# Patient Record
Sex: Female | Born: 1970 | Race: White | Hispanic: No | Marital: Single | Smoking: Never smoker
Health system: Southern US, Community
[De-identification: ages and names within clinical notes are randomized; demographics above are authoritative.]

## PROBLEM LIST (undated history)

## (undated) DIAGNOSIS — G43909 Migraine, unspecified, not intractable, without status migrainosus: Secondary | ICD-10-CM

## (undated) HISTORY — PX: TUBAL LIGATION: SHX77

---

## 2016-06-13 ENCOUNTER — Encounter (HOSPITAL_COMMUNITY): Payer: Self-pay | Admitting: Emergency Medicine

## 2016-06-13 ENCOUNTER — Emergency Department (HOSPITAL_COMMUNITY)
Admission: EM | Admit: 2016-06-13 | Discharge: 2016-06-13 | Disposition: A | Discharge status: Home / Self Care | Payer: No Typology Code available for payment source | Attending: Emergency Medicine | Admitting: Emergency Medicine

## 2016-06-13 ENCOUNTER — Emergency Department (HOSPITAL_COMMUNITY): Payer: No Typology Code available for payment source

## 2016-06-13 DIAGNOSIS — Y92410 Unspecified street and highway as the place of occurrence of the external cause: Secondary | ICD-10-CM | POA: Diagnosis not present

## 2016-06-13 DIAGNOSIS — Y939 Activity, unspecified: Secondary | ICD-10-CM | POA: Insufficient documentation

## 2016-06-13 DIAGNOSIS — R51 Headache: Secondary | ICD-10-CM | POA: Diagnosis not present

## 2016-06-13 DIAGNOSIS — S199XXA Unspecified injury of neck, initial encounter: Secondary | ICD-10-CM | POA: Diagnosis present

## 2016-06-13 DIAGNOSIS — Y999 Unspecified external cause status: Secondary | ICD-10-CM | POA: Diagnosis not present

## 2016-06-13 DIAGNOSIS — S161XXA Strain of muscle, fascia and tendon at neck level, initial encounter: Secondary | ICD-10-CM | POA: Diagnosis not present

## 2016-06-13 DIAGNOSIS — M546 Pain in thoracic spine: Secondary | ICD-10-CM | POA: Diagnosis not present

## 2016-06-13 DIAGNOSIS — Z79899 Other long term (current) drug therapy: Secondary | ICD-10-CM | POA: Insufficient documentation

## 2016-06-13 HISTORY — DX: Migraine, unspecified, not intractable, without status migrainosus: G43.909

## 2016-06-13 MED ORDER — METHOCARBAMOL 500 MG PO TABS: 500.0000 mg | ORAL_TABLET | Freq: Two times a day (BID) | ORAL | 0 refills | Status: DC | PRN

## 2016-06-13 MED ORDER — IBUPROFEN 800 MG PO TABS
800.0000 mg | ORAL_TABLET | Freq: Once | ORAL | Status: AC
  Administered 2016-06-13: 800 mg via ORAL
  Filled 2016-06-13: qty 1

## 2016-06-13 MED ORDER — IBUPROFEN 800 MG PO TABS: 800.0000 mg | ORAL_TABLET | Freq: Three times a day (TID) | ORAL | 0 refills | Status: AC

## 2016-06-13 MED ORDER — METHOCARBAMOL 500 MG PO TABS
500.0000 mg | ORAL_TABLET | Freq: Once | ORAL | Status: AC
  Administered 2016-06-13: 500 mg via ORAL
  Filled 2016-06-13: qty 1
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
## 2016-06-13 NOTE — ED Notes (Signed)
Pt ambulated without assistance to restroom 
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
## 2021-11-20 ENCOUNTER — Other Ambulatory Visit (HOSPITAL_BASED_OUTPATIENT_CLINIC_OR_DEPARTMENT_OTHER): Payer: Self-pay

## 2021-11-20 MED ORDER — SUMATRIPTAN SUCCINATE 6 MG/0.5ML ~~LOC~~ SOAJ
SUBCUTANEOUS | 5 refills | Status: DC
  Filled 2021-11-20: qty 2, 4d supply, fill #0
  Filled 2021-12-22: qty 2, 4d supply, fill #1
  Filled 2022-01-15: qty 2, 4d supply, fill #2
  Filled 2022-02-26: qty 2, 4d supply, fill #3
  Filled 2022-03-18: qty 2, 4d supply, fill #4
  Filled 2022-04-23: qty 2, 4d supply, fill #5

## 2021-11-20 MED ORDER — AMITRIPTYLINE HCL 25 MG PO TABS
25.0000 mg | ORAL_TABLET | Freq: Every day | ORAL | 3 refills | Status: DC
  Filled 2021-11-20: qty 30, 30d supply, fill #0
  Filled 2021-12-22: qty 30, 30d supply, fill #1

## 2021-11-21 ENCOUNTER — Other Ambulatory Visit (HOSPITAL_BASED_OUTPATIENT_CLINIC_OR_DEPARTMENT_OTHER): Payer: Self-pay

## 2021-12-22 ENCOUNTER — Other Ambulatory Visit (HOSPITAL_BASED_OUTPATIENT_CLINIC_OR_DEPARTMENT_OTHER): Payer: Self-pay

## 2021-12-24 ENCOUNTER — Other Ambulatory Visit (HOSPITAL_BASED_OUTPATIENT_CLINIC_OR_DEPARTMENT_OTHER): Payer: Self-pay

## 2021-12-26 ENCOUNTER — Other Ambulatory Visit (HOSPITAL_BASED_OUTPATIENT_CLINIC_OR_DEPARTMENT_OTHER): Payer: Self-pay

## 2022-01-15 ENCOUNTER — Other Ambulatory Visit: Payer: Self-pay

## 2022-01-15 ENCOUNTER — Other Ambulatory Visit (HOSPITAL_BASED_OUTPATIENT_CLINIC_OR_DEPARTMENT_OTHER): Payer: Self-pay

## 2022-01-15 MED ORDER — KETOROLAC TROMETHAMINE 10 MG PO TABS
10.0000 mg | ORAL_TABLET | Freq: Four times a day (QID) | ORAL | 0 refills | Status: DC | PRN
  Filled 2022-01-15 (×2): qty 20, 5d supply, fill #0

## 2022-01-15 MED ORDER — AMITRIPTYLINE HCL 50 MG PO TABS
50.0000 mg | ORAL_TABLET | Freq: Every day | ORAL | 3 refills | Status: AC
  Filled 2022-01-15 (×2): qty 30, 30d supply, fill #0
  Filled 2022-03-18: qty 30, 30d supply, fill #1
  Filled 2022-04-23: qty 30, 30d supply, fill #2
  Filled 2022-05-16: qty 30, 30d supply, fill #3

## 2022-01-15 MED ORDER — ROSUVASTATIN CALCIUM 5 MG PO TABS
5.0000 mg | ORAL_TABLET | Freq: Every day | ORAL | 2 refills | Status: DC
  Filled 2022-01-15: qty 30, 30d supply, fill #0
  Filled 2022-03-18: qty 30, 30d supply, fill #1
  Filled 2022-04-23: qty 30, 30d supply, fill #2

## 2022-01-16 ENCOUNTER — Other Ambulatory Visit (HOSPITAL_BASED_OUTPATIENT_CLINIC_OR_DEPARTMENT_OTHER): Payer: Self-pay

## 2022-01-16 ENCOUNTER — Other Ambulatory Visit: Payer: Self-pay

## 2022-01-17 ENCOUNTER — Other Ambulatory Visit (HOSPITAL_BASED_OUTPATIENT_CLINIC_OR_DEPARTMENT_OTHER): Payer: Self-pay

## 2022-01-22 ENCOUNTER — Ambulatory Visit: Payer: 59 | Admitting: Neurology

## 2022-02-26 ENCOUNTER — Other Ambulatory Visit (HOSPITAL_BASED_OUTPATIENT_CLINIC_OR_DEPARTMENT_OTHER): Payer: Self-pay

## 2022-03-04 ENCOUNTER — Ambulatory Visit: Payer: 59 | Admitting: Neurology

## 2022-03-18 ENCOUNTER — Other Ambulatory Visit (HOSPITAL_BASED_OUTPATIENT_CLINIC_OR_DEPARTMENT_OTHER): Payer: Self-pay

## 2022-03-20 ENCOUNTER — Other Ambulatory Visit (HOSPITAL_BASED_OUTPATIENT_CLINIC_OR_DEPARTMENT_OTHER): Payer: Self-pay

## 2022-04-23 ENCOUNTER — Other Ambulatory Visit (HOSPITAL_BASED_OUTPATIENT_CLINIC_OR_DEPARTMENT_OTHER): Payer: Self-pay

## 2022-05-16 ENCOUNTER — Other Ambulatory Visit (HOSPITAL_BASED_OUTPATIENT_CLINIC_OR_DEPARTMENT_OTHER): Payer: Self-pay

## 2022-05-17 ENCOUNTER — Other Ambulatory Visit (HOSPITAL_BASED_OUTPATIENT_CLINIC_OR_DEPARTMENT_OTHER): Payer: Self-pay

## 2022-05-17 MED ORDER — ROSUVASTATIN CALCIUM 5 MG PO TABS
5.0000 mg | ORAL_TABLET | Freq: Every day | ORAL | 2 refills | Status: AC
  Filled 2022-05-17: qty 90, 90d supply, fill #0

## 2022-05-17 MED ORDER — SUMATRIPTAN SUCCINATE 6 MG/0.5ML ~~LOC~~ SOAJ
SUBCUTANEOUS | 2 refills | Status: DC
  Filled 2022-05-17: qty 4, 30d supply, fill #0

## 2022-05-22 ENCOUNTER — Other Ambulatory Visit (HOSPITAL_BASED_OUTPATIENT_CLINIC_OR_DEPARTMENT_OTHER): Payer: Self-pay

## 2022-06-02 ENCOUNTER — Other Ambulatory Visit (HOSPITAL_BASED_OUTPATIENT_CLINIC_OR_DEPARTMENT_OTHER): Payer: Self-pay

## 2022-06-17 ENCOUNTER — Other Ambulatory Visit (HOSPITAL_BASED_OUTPATIENT_CLINIC_OR_DEPARTMENT_OTHER): Payer: Self-pay

## 2022-06-17 MED ORDER — SUMATRIPTAN SUCCINATE 6 MG/0.5ML ~~LOC~~ SOAJ
6.0000 mg | Freq: Every day | SUBCUTANEOUS | 2 refills | Status: DC | PRN
  Filled 2022-06-17 – 2022-06-22 (×2): qty 4, 30d supply, fill #0
  Filled 2022-07-23: qty 4, 30d supply, fill #1

## 2022-06-22 ENCOUNTER — Other Ambulatory Visit (HOSPITAL_BASED_OUTPATIENT_CLINIC_OR_DEPARTMENT_OTHER): Payer: Self-pay

## 2022-06-24 ENCOUNTER — Other Ambulatory Visit (HOSPITAL_BASED_OUTPATIENT_CLINIC_OR_DEPARTMENT_OTHER): Payer: Self-pay

## 2022-06-24 MED ORDER — KETOCONAZOLE 2 % EX SHAM
MEDICATED_SHAMPOO | CUTANEOUS | 2 refills | Status: DC
  Filled 2022-06-24: qty 120, 28d supply, fill #0

## 2022-07-05 ENCOUNTER — Other Ambulatory Visit (HOSPITAL_BASED_OUTPATIENT_CLINIC_OR_DEPARTMENT_OTHER): Payer: Self-pay

## 2022-07-05 MED ORDER — GRISEOFULVIN MICROSIZE 500 MG PO TABS
500.0000 mg | ORAL_TABLET | Freq: Every day | ORAL | 0 refills | Status: DC
  Filled 2022-07-05: qty 28, 28d supply, fill #0

## 2022-07-05 MED ORDER — KETOROLAC TROMETHAMINE 10 MG PO TABS
10.0000 mg | ORAL_TABLET | Freq: Four times a day (QID) | ORAL | 0 refills | Status: DC | PRN
  Filled 2022-07-05: qty 20, 5d supply, fill #0

## 2022-07-07 ENCOUNTER — Other Ambulatory Visit (HOSPITAL_BASED_OUTPATIENT_CLINIC_OR_DEPARTMENT_OTHER): Payer: Self-pay

## 2022-07-08 ENCOUNTER — Other Ambulatory Visit (HOSPITAL_BASED_OUTPATIENT_CLINIC_OR_DEPARTMENT_OTHER): Payer: Self-pay

## 2022-07-09 ENCOUNTER — Other Ambulatory Visit (HOSPITAL_BASED_OUTPATIENT_CLINIC_OR_DEPARTMENT_OTHER): Payer: Self-pay

## 2022-07-10 ENCOUNTER — Other Ambulatory Visit (HOSPITAL_BASED_OUTPATIENT_CLINIC_OR_DEPARTMENT_OTHER): Payer: Self-pay

## 2022-07-20 ENCOUNTER — Other Ambulatory Visit (HOSPITAL_BASED_OUTPATIENT_CLINIC_OR_DEPARTMENT_OTHER): Payer: Self-pay

## 2022-07-22 ENCOUNTER — Other Ambulatory Visit (HOSPITAL_BASED_OUTPATIENT_CLINIC_OR_DEPARTMENT_OTHER): Payer: Self-pay

## 2022-07-23 ENCOUNTER — Other Ambulatory Visit (HOSPITAL_BASED_OUTPATIENT_CLINIC_OR_DEPARTMENT_OTHER): Payer: Self-pay

## 2022-07-24 ENCOUNTER — Other Ambulatory Visit: Payer: Self-pay

## 2022-07-27 ENCOUNTER — Other Ambulatory Visit (HOSPITAL_BASED_OUTPATIENT_CLINIC_OR_DEPARTMENT_OTHER): Payer: Self-pay

## 2022-08-06 ENCOUNTER — Other Ambulatory Visit (HOSPITAL_BASED_OUTPATIENT_CLINIC_OR_DEPARTMENT_OTHER): Payer: Self-pay

## 2022-08-06 MED ORDER — CLOBETASOL PROPIONATE 0.05 % EX SHAM
MEDICATED_SHAMPOO | CUTANEOUS | 1 refills | Status: AC
  Filled 2022-08-06: qty 118, 30d supply, fill #0

## 2022-08-07 ENCOUNTER — Other Ambulatory Visit (HOSPITAL_BASED_OUTPATIENT_CLINIC_OR_DEPARTMENT_OTHER): Payer: Self-pay

## 2022-08-08 ENCOUNTER — Other Ambulatory Visit (HOSPITAL_BASED_OUTPATIENT_CLINIC_OR_DEPARTMENT_OTHER): Payer: Self-pay

## 2022-08-16 ENCOUNTER — Other Ambulatory Visit: Payer: Self-pay

## 2022-08-16 ENCOUNTER — Other Ambulatory Visit (HOSPITAL_BASED_OUTPATIENT_CLINIC_OR_DEPARTMENT_OTHER): Payer: Self-pay

## 2022-08-16 MED ORDER — SUMATRIPTAN SUCCINATE 6 MG/0.5ML ~~LOC~~ SOAJ
SUBCUTANEOUS | 2 refills | Status: DC
  Filled 2022-08-16: qty 4, 30d supply, fill #0
  Filled 2022-09-17: qty 2, 15d supply, fill #1

## 2022-08-16 MED ORDER — AMITRIPTYLINE HCL 50 MG PO TABS
50.0000 mg | ORAL_TABLET | Freq: Every evening | ORAL | 3 refills | Status: AC
  Filled 2022-08-16: qty 30, 30d supply, fill #0

## 2022-08-19 ENCOUNTER — Other Ambulatory Visit (HOSPITAL_BASED_OUTPATIENT_CLINIC_OR_DEPARTMENT_OTHER): Payer: Self-pay

## 2022-08-26 ENCOUNTER — Other Ambulatory Visit (HOSPITAL_BASED_OUTPATIENT_CLINIC_OR_DEPARTMENT_OTHER): Payer: Self-pay

## 2022-09-17 ENCOUNTER — Other Ambulatory Visit (HOSPITAL_BASED_OUTPATIENT_CLINIC_OR_DEPARTMENT_OTHER): Payer: Self-pay

## 2022-09-17 MED ORDER — SUMATRIPTAN SUCCINATE 6 MG/0.5ML ~~LOC~~ SOAJ
SUBCUTANEOUS | 2 refills | Status: DC
  Filled 2022-09-17: qty 4, 30d supply, fill #0
  Filled 2022-09-24: qty 4, 28d supply, fill #0
  Filled 2022-09-28 – 2022-10-24 (×2): qty 4, 30d supply, fill #0

## 2022-09-23 ENCOUNTER — Other Ambulatory Visit (HOSPITAL_BASED_OUTPATIENT_CLINIC_OR_DEPARTMENT_OTHER): Payer: Self-pay

## 2022-09-23 MED ORDER — CLOBETASOL PROPIONATE 0.05 % EX SOLN
1.0000 | Freq: Two times a day (BID) | CUTANEOUS | 0 refills | Status: AC
  Filled 2022-09-23: qty 50, 25d supply, fill #0

## 2022-09-24 ENCOUNTER — Other Ambulatory Visit (HOSPITAL_BASED_OUTPATIENT_CLINIC_OR_DEPARTMENT_OTHER): Payer: Self-pay

## 2022-09-24 ENCOUNTER — Ambulatory Visit: Payer: 59 | Admitting: Neurology

## 2022-09-24 ENCOUNTER — Encounter: Payer: Self-pay | Admitting: Neurology

## 2022-09-24 VITALS — BP 122/76 | HR 69 | Ht 68.0 in | Wt 232.0 lb

## 2022-09-24 DIAGNOSIS — G43711 Chronic migraine without aura, intractable, with status migrainosus: Secondary | ICD-10-CM

## 2022-09-24 MED ORDER — UBRELVY 100 MG PO TABS: 100.0000 mg | ORAL_TABLET | ORAL | 0 refills | Status: DC | PRN

## 2022-09-24 MED ORDER — AJOVY 225 MG/1.5ML ~~LOC~~ SOAJ: 225.0000 mg | SUBCUTANEOUS | Status: DC

## 2022-09-24 NOTE — Progress Notes (Signed)
[facility]    Provider:  Dr Lucia Gaskins Requesting Provider: Joya Martyr, MD Primary Care Provider:  Thana Ates, MD  CC:  chronic migraines  HPI:  Lindsay Stanley is a 52 y.o. female here as requested by Joya Martyr, MD for chronic migraines. She gets her migraines 2-3 days before period as well as other times and maybe in the middle when tapering off. Worse with stress. And then randomly during the month. Getting stressed will give her a migraine. She can feel it coming and gradually gets worse. Sometimes will wake in the middle of the night. Left side, pulsating/pounding, nausea and vomiting, moderate to severe can last 24 hours. 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 Severe, "last month was hell", Last month she ran out of medication and she was sick and missed work. Light sensitivity. Last month and for at least 4 months she had 23 migraine days a month moderate to severe and last a day. She takes goody powders, caffeine and dramamine and then sleep may help.  No aura. No med overuse. No other focal neurologic deficits, associated symptoms, inciting events or modifiable factors.  Tried > 3 months from a thorough review of records and patient reported history: topiramate(hair fall out) , gabapentin. Tegretol, amitriptyline, metoprolol, zomig, sumatriptan, zonisamide. She has hypotension. Aimovig contraindicated due to constipation. No aura.    Reviewed notes, labs and imaging from outside physicians, which showed:  02/29/2020: mri brain: IMPRESSION: 1.  Abnormal region of restricted diffusion with associated T2/FLAIR hyperintensity within the right frontal lobe compatible with known acute to subacute infarction.  Electronically Signed by: Marlowe Sax on 02/29/2020 10:27 AM Narrative  MRI HEAD WO IV CONTRAST  INDICATION:  stroke  COMPARISON: February 28, 2020 CT head  TECHNIQUE: Imaging of the brain was performed in 3  planes utilizing a combination of T1, FLAIR, and T2 weighting. Diffusion images were performed.  FINDINGS: Abnormal region of restricted diffusion with associated T2/FLAIR hyperintensity within the right frontal lobe compatible with known acute to subacute infarction. No mass or mass effect. No abnormal extra-axial fluid collection.  No hydrocephalus. Abnormal flow void within the right internal carotid artery secondary to occlusion an severe stenosis is better evaluated on the CTA from one day prior.  The included paranasal sinuses are predominantly clear. The mastoid air cells are clear. Normal marrow signal pattern. Procedure Note  Marlowe Sax, MD - 02/29/2020 Formatting of this note might be different from the original. MRI HEAD WO IV CONTRAST  INDICATION:  stroke  COMPARISON: February 28, 2020 CT head  TECHNIQUE: Imaging of the brain was performed in 3 planes utilizing a combination of T1, FLAIR, and T2 weighting. Diffusion images were performed.  FINDINGS: Abnormal region of restricted diffusion with associated T2/FLAIR hyperintensity within the right frontal lobe compatible with known acute to subacute infarction. No mass or mass effect. No abnormal extra-axial fluid collection.  No hydrocephalus. Abnormal flow void within the right internal carotid artery secondary to occlusion an severe stenosis is better evaluated on the CTA from one day prior.  The included paranasal sinuses are predominantly clear. The mastoid air cells are clear. Normal marrow signal pattern.   IMPRESSION: 1.  Abnormal region of restricted diffusion with associated T2/FLAIR hyperintensity within the right frontal lobe compatible with known acute to subacute infarction.  Carotid US 02-29-2020: IMPRESSION: 1.  No hemodynamically significant stenosis on either side.   2.  Both vertebral arteries are patent with antegrade flow.  Degree of stenosis is determined using NASCET measurement  technique: Severe:  70-90% Moderate:  50-69% Mild:  Less than 50%  Electronically Signed by: Marlise Eves on 03/01/2020 7:56 AM Narrative  INDICATION:  CVA COMPARISON: None. TECHNIQUE: Grayscale and color-flow Doppler imaging of the carotid vessels was obtained with spectral analysis. Reported velocities are  PSV/EDV in cm/sec. LIMITING FACTORS (Tortuosity, Short Neck, Patient Motion):  None.  FINDINGS:  RIGHT CAROTID SYSTEM: Right common carotid:  91/16 Right internal carotid:  139/33 Right ICA/CCA ratio:  1.52 Right external carotid:  Patent with anterograde flow. Normal spectral waveforms.  LEFT CAROTID SYSTEM: Left common carotid:  137/34 Left internal carotid:  190/104 Left ICA/CCA ratio:  1.38 Left external carotid:  Patent with anterograde flow. Normal spectral waveforms.  VERTEBRAL ARTERIES: Right vertebral:  Anterograde with normal spectral waveform.   Left vertebral:  Anterograde with normal spectral waveform.    ADDITIONAL/INCIDENTAL FINDINGS:   Bilateral carotid bifurcation plaque is present.  Sodium -- -- -- -- 139 Load older lab results  Potassium -- 4.6 -- 4.3 4.0 Load older lab results  Chloride -- -- -- -- 102 Load older lab results  Carbon Dioxide (CO2) -- -- -- -- -- Load older lab results  Anion Gap -- -- -- -- -- Load older lab results  Glucose -- 102 High     -- 92 106 High    Load older lab results  BUN -- 14 -- 17 22 Load older lab results  Creatinine -- 0.80 -- 0.68 0.76 Load older lab results  Calcium -- -- -- -- -- Load older lab results  Alkaline Phosphatase 70 -- -- -- 58 Load older lab results  Total Bilirubin 0.2 -- -- -- 0.2 Load older lab results  Total Protein 6.7 -- -- 6.3 6.9 Load older lab results  Albumin, Serum 4.4 -- -- -- 4.3 Load older lab results  Globulin -- -- -- -- -- Load older lab results  Albumin/Globulin Ratio -- -- -- -- 1.7 Load older lab results  BUN/Creatinine Ratio -- -- -- -- 29 High    Load older lab  results  ALT (SGPT) 18 -- -- -- 26 Load older lab results  AST 16 -- -- 21 21 Load older lab results  GFR-African American -- -- -- -- -- Load older lab results  GFR Non-African American -- -- -- -- -- Load older lab results  Creatinine, Serum -- -- -- -- -- Load older lab results  eGFR If NonAfrican American -- -- -- -- 93 Load older lab results  eGFR If African American -- -- -- -- 107 Load older lab results  CO2 -- 27 -- 25 25 Load older lab results  CALCIUM -- -- -- -- 9.4 Load older lab results  Globulin, Total -- -- -- -- 2.6 Load older lab results  Bilirubin, Direct <0.10 -- -- -- --   Lipase -- -- -- -- -- Load older lab results  Amylase -- -- -- -- -- Load older lab results  Na -- 139 -- 136 --   Cl -- 103 -- 100 --   AGAP -- 9 -- 11 --   Ca -- 9.3 -- 9.1 --   ALK PHOS -- -- -- 58 --   T Bili -- -- -- 0.15 --   Alb -- -- -- 4.5 --   GLOBULIN -- -- -- 1.8 --   ALBUMIN/GLOBULIN RATIO -- -- -- 2.5 --   BUN/CREAT RATIO -- 17.5 -- 25.0 --  ALT -- -- -- 26 --   GFR AFRICAN AMERICAN -- 101 -- 120 --   GFR Non African American -- 87 -- 104 --   Mg -- -- 2.1      02/29/2020: hgba1c 5.0  Review of Systems: Patient complains of symptoms per HPI as well as the following symptoms headaches. Pertinent negatives and positives per HPI. All others negative.   Social History   Socioeconomic History   Marital status: Single    Spouse name: Not on file   Number of children: Not on file   Years of education: Not on file   Highest education level: Not on file  Occupational History   Not on file  Tobacco Use   Smoking status: Never   Smokeless tobacco: Never  Substance and Sexual Activity   Alcohol use: Yes    Comment: occ   Drug use: No   Sexual activity: Not on file  Other Topics Concern   Not on file  Social History Narrative   Not on file   Social Determinants of Health   Financial Resource Strain: Low Risk  (02/29/2020)   Received from [facility], [facility]    Overall Financial Resource Strain (CARDIA)    Difficulty of Paying Living Expenses: Not hard at all  Food Insecurity: No Food Insecurity (02/28/2020)   Received from [facility], [facility]   Hunger Vital Sign    Worried About Running Out of Food in the Last Year: Never true    Ran Out of Food in the Last Year: Never true  Transportation Needs: Not on file  Physical Activity: Not on file  Stress: No Stress Concern Present (02/29/2020)   Received from [facility], [facility]   [employer] of Occupational Health - Occupational Stress Questionnaire    Feeling of Stress : Not at all  Social Connections: Unknown (05/25/2021)   Received from [facility], [facility]   Social Network    Social Network: Not on file  Intimate Partner Violence: Unknown (04/17/2021)   Received from [facility], [facility]   HITS    Physically Hurt: Not on file    Insult or Talk Down To: Not on file    Threaten Physical Harm: Not on file    Scream or Curse: Not on file    Family History  Problem Relation Age of Onset   Migraines Neg Hx     Past Medical History:  Diagnosis Date   Migraines     Patient Active Problem List   Diagnosis Date Noted   Chronic migraine without aura without status migrainosus, not intractable 09/27/2022    Past Surgical History:  Procedure Laterality Date   TUBAL LIGATION      Current Outpatient Medications  Medication Sig Dispense Refill   amitriptyline (ELAVIL) 50 MG tablet Take 1 tablet (50 mg total) by mouth at bedtime. 30 tablet 3   amitriptyline (ELAVIL) 50 MG tablet Take 1 tablet (50 mg total) by mouth at bedtime. 30 tablet 3   clobetasol (TEMOVATE) 0.05 % external solution Apply 1 Application to scalp topically 2 (two) times daily for 28 days. 50 mL 0   Clobetasol Propionate 0.05 % shampoo Apply to scalp every 2-3 days 118 mL 1   Fremanezumab-vfrm (AJOVY) 225 MG/1.5ML SOAJ Inject 225 mg into the skin every 30 (thirty) days.      Fremanezumab-vfrm (AJOVY) 225 MG/1.5ML SOAJ Inject 225 mg into the skin every 30 (thirty) days. Please run copay card BIN# [id number]  PCN# [id number] GRP# [id number] ID# [id number] 1.5 mL 11   ibuprofen (ADVIL,MOTRIN) 800 MG tablet Take 1 tablet (800 mg total) by mouth 3 (three) times daily. 21 tablet 0   rosuvastatin (CRESTOR) 5 MG tablet Take 1 tablet (5 mg total) by mouth daily. 90 tablet 2   SUMAtriptan (IMITREX STATDOSE SYSTEM) 6 MG/0.5ML SOAJ Inject as directed under the skin daily as needed for migraine. 2 mL 5   SUMAtriptan (IMITREX STATDOSE SYSTEM) 6 MG/0.5ML SOAJ Use as directed Subcutaneous daily as needed for migraine 4 mL 2   SUMAtriptan (IMITREX STATDOSE SYSTEM) 6 MG/0.5ML SOAJ Use as directed Subcutaneous daily as needed 4 mL 2   Ubrogepant (UBRELVY) 100 MG TABS Take 1 tablet (100 mg total) by mouth every 2 (two) hours as needed. Maximum 200mg  a day. 4 tablet 0   No current facility-administered medications for this visit.    Allergies as of 09/24/2022 - Review Complete 09/24/2022  Allergen Reaction Noted   Topiramate  09/24/2022    Vitals: BP 122/76   Pulse 69   Ht 5\' 8"  (1.727 m)   Wt 232 lb (105.2 kg)   BMI 35.28 kg/m  Last Weight:  Wt Readings from Last 1 Encounters:  09/24/22 232 lb (105.2 kg)   Last Height:   Ht Readings from Last 1 Encounters:  09/24/22 5\' 8"  (1.727 m)     Physical exam: Exam: Gen: NAD, conversant, well nourised, obese, well groomed                     CV: RRR, no MRG. No Carotid Bruits. No peripheral edema, warm, nontender Eyes: Conjunctivae clear without exudates or hemorrhage  Neuro: Detailed Neurologic Exam  Speech:    Speech is normal; fluent and spontaneous with normal comprehension.  Cognition:    The patient is oriented to person, place, and time;     recent and remote memory intact;     language fluent;     normal attention, concentration,     fund of knowledge Cranial Nerves:    The pupils are equal, round, and reactive to  light. The fundi are normal and spontaneous venous pulsations are present. Visual fields are full to finger confrontation. Extraocular movements are intact. Trigeminal sensation is intact and the muscles of mastication are normal. The face is symmetric. The palate elevates in the midline. Hearing intact. Voice is normal. Shoulder shrug is normal. The tongue has normal motion without fasciculations.   Coordination: nml  Gait: nml  Motor Observation:    No asymmetry, no atrophy, and no involuntary movements noted. Tone:    Normal muscle tone.    Posture:    Posture is normal. normal erect    Strength:    Strength is V/V in the upper and lower limbs.      Sensation: intact to LT     Reflex Exam:  DTR's:    Deep tendon reflexes in the upper and lower extremities are normal bilaterally.   Toes:    The toes are downgoing bilaterally.   Clonus:    Clonus is absent.    Assessment/Plan:  patient with chronic migraines and h/o stroke  Recommend once monthly injection for migraine prevention (Ajovy or Emgality) For acute/emergent Ubrelvy or Nurtec, can prescribe when she improves on Ajovy to less than 8 migraine days a month and less than 14 total headache days a month gave samples to try. Also discussed other modalities such as medications and lifestyle and diet.  'Other  new meds include Vyepti (infusion every 3 months) Always Botox which is also great Aimovig also but that one has had some side effects Prescribe Nurtec or Ubrelvy when improved gave samples  Start Ajovy for prevention If had a carotid dissection could still use triptans but recommend against it for precaution.  Meds ordered this encounter  Medications   Fremanezumab-vfrm (AJOVY) 225 MG/1.5ML SOAJ    Sig: Inject 225 mg into the skin every 30 (thirty) days.    Patient has copay card; she can have medication regardless of insurance approval or copay amount.   Ubrogepant (UBRELVY) 100 MG TABS    Sig: Take 1 tablet  (100 mg total) by mouth every 2 (two) hours as needed. Maximum 200mg  a day.    Dispense:  4 tablet    Refill:  0   Fremanezumab-vfrm (AJOVY) 225 MG/1.5ML SOAJ    Sig: Inject 225 mg into the skin every 30 (thirty) days. Please run copay card BIN# [id number] PCN# [id number] GRP# [id number] ID# [id number]    Dispense:  1.5 mL    Refill:  11    Patient has copay card; she can have medication regardless of insurance approval or copay amount. Please run copay card BIN# [id number] PCN# [id number] GRP# [id number] ID# [id number]   To prevent or relieve headaches, try the following: Cool Compress. Lie down and place a cool compress on your head.  Avoid headache triggers. If certain foods or odors seem to have triggered your migraines in the past, avoid them. A headache diary might help you identify triggers.  Include physical activity in your daily routine. Try a daily walk or other moderate aerobic exercise.  Manage stress. Find healthy ways to cope with the stressors, such as delegating tasks on your to-do list.  Practice relaxation techniques. Try deep breathing, yoga, massage and visualization.  Eat regularly. Eating regularly scheduled meals and maintaining a healthy diet might help prevent headaches. Also, drink plenty of fluids.  Follow a regular sleep schedule. Sleep deprivation might contribute to headaches Consider biofeedback. With this mind-body technique, you learn to control certain bodily functions -- such as muscle tension, heart rate and blood pressure -- to prevent headaches or reduce headache pain.    Proceed to emergency room if you experience new or worsening symptoms or symptoms do not resolve, if you have new neurologic symptoms or if headache is severe, or for any concerning symptom.   Cc: Joya Martyr, MD,  Thana Ates, MD  Naomie Dean, MD  [facility] [address] [place], [place] [address]  Phone [phone] Fax [phone]

## 2022-09-24 NOTE — Patient Instructions (Addendum)
Recommend once monthly injection for migraine prevention (Ajovy or Emgality) For acute/emergent Bernita Raisin or Nurtec  'Other new meds include Vyepti (infusion every 3 months) Always Botox which is also great Aimovig also but that one has had some side effects Prescribe Nurtec or [employer] Injection What is this medication? FREMANEZUMAB (fre ma NEZ ue mab) prevents migraines. It works by blocking a substance in the body that causes migraines. It is a monoclonal antibody. This medicine may be used for other purposes; ask your health care provider or pharmacist if you have questions. COMMON BRAND NAME(S): AJOVY What should I tell my care team before I take this medication? They need to know if you have any of these conditions: An unusual or allergic reaction to fremanezumab, other medications, foods, dyes, or preservatives Pregnant or trying to get pregnant Breast-feeding How should I use this medication? This medication is injected under the skin. You will be taught how to prepare and give it. Take it as directed on the prescription label. Keep taking it unless your care team tells you to stop. It is important that you put your used needles and syringes in a special sharps container. Do not put them in a trash can. If you do not have a sharps container, call your pharmacist or care team to get one. Talk to your care team about the use of this medication in children. Special care may be needed. Overdosage: If you think you have taken too much of this medicine contact a poison control center or emergency room at once. NOTE: This medicine is only for you. Do not share this medicine with others. What if I miss a dose? If you miss a dose, take it as soon as you can. If it is almost time for your next dose, take only that dose. Do not take double or extra doses. What may interact with this medication? Interactions are not expected. This list may not describe all possible  interactions. Give your health care provider a list of all the medicines, herbs, non-prescription drugs, or dietary supplements you use. Also tell them if you smoke, drink alcohol, or use illegal drugs. Some items may interact with your medicine. What should I watch for while using this medication? Tell your care team if your symptoms do not start to get better or if they get worse. What side effects may I notice from receiving this medication? Side effects that you should report to your care team as soon as possible: Allergic reactions or angioedema--skin rash, itching or hives, swelling of the face, eyes, lips, tongue, arms, or legs, trouble swallowing or breathing Side effects that usually do not require medical attention (report to your care team if they continue or are bothersome): Pain, redness, or irritation at injection site This list may not describe all possible side effects. Call your doctor for medical advice about side effects. You may report side effects to FDA at 1-800-FDA-1088. Where should I keep my medication? Keep out of the reach of children and pets. Store in a refrigerator or at room temperature between 20 and 25 degrees C (68 and 77 degrees F). Refrigeration (preferred): Store in the refrigerator. Do not freeze. Keep in the original container until you are ready to take it. Remove the dose from the carton about 30 minutes before it is time for you to use it. If the dose is not used, it may be stored in the original container at room temperature for 7 days. Get rid  of any unused medication after the expiration date. Room Temperature: This medication may be stored at room temperature for up to 7 days. Keep it in the original container. Protect from light until time of use. If it is stored at room temperature, get rid of any unused medication after 7 days or after it expires, whichever is first. To get rid of medications that are no longer needed or have expired: Take the  medication to a medication take-back program. Check with your pharmacy or law enforcement to find a location. If you cannot return the medication, ask your pharmacist or care team how to get rid of this medication safely. NOTE: This sheet is a summary. It may not cover all possible information. If you have questions about this medicine, talk to your doctor, pharmacist, or health care provider.  2024 Elsevier/Gold Standard (2021-02-23 00:00:00)  Rimegepant Disintegrating Tablets What is this medication? RIMEGEPANT (ri ME je pant) prevents and treats migraines. It works by blocking a substance in the body that causes migraines. This medicine may be used for other purposes; ask your health care provider or pharmacist if you have questions. COMMON BRAND NAME(S): NURTEC ODT What should I tell my care team before I take this medication? They need to know if you have any of these conditions: Kidney disease Liver disease An unusual or allergic reaction to rimegepant, other medications, foods, dyes, or preservatives Pregnant or trying to get pregnant Breast-feeding How should I use this medication? Take this medication by mouth. Take it as directed on the prescription label. Leave the tablet in the sealed pack until you are ready to take it. With dry hands, open the pack and gently remove the tablet. If the tablet breaks or crumbles, throw it away. Use a new tablet. Place the tablet in the mouth and allow it to dissolve. Then, swallow it. Do not cut, crush, or chew this medication. You do not need water to take this medication. Talk to your care team about the use of this medication in children. Special care may be needed. Overdosage: If you think you have taken too much of this medicine contact a poison control center or emergency room at once. NOTE: This medicine is only for you. Do not share this medicine with others. What if I miss a dose? This does not apply. This medication is not for regular  use. What may interact with this medication? Certain medications for fungal infections, such as fluconazole, itraconazole Rifampin This list may not describe all possible interactions. Give your health care provider a list of all the medicines, herbs, non-prescription drugs, or dietary supplements you use. Also tell them if you smoke, drink alcohol, or use illegal drugs. Some items may interact with your medicine. What should I watch for while using this medication? Visit your care team for regular checks on your progress. Tell your care team if your symptoms do not start to get better or if they get worse. What side effects may I notice from receiving this medication? Side effects that you should report to your care team as soon as possible: Allergic reactions--skin rash, itching, hives, swelling of the face, lips, tongue, or throat Side effects that usually do not require medical attention (report to your care team if they continue or are bothersome): Nausea Stomach pain This list may not describe all possible side effects. Call your doctor for medical advice about side effects. You may report side effects to FDA at 1-800-FDA-1088. Where should I keep my medication? Keep  out of the reach of children and pets. Store at room temperature between 20 and 25 degrees C (68 and 77 degrees F). Get rid of any unused medication after the expiration date. To get rid of medications that are no longer needed or have expired: Take the medication to a medication take-back program. Check with your pharmacy or law enforcement to find a location. If you cannot return the medication, check the label or package insert to see if the medication should be thrown out in the garbage or flushed down the toilet. If you are not sure, ask your care team. If it is safe to put it in the trash, take the medication out of the container. Mix the medication with cat litter, dirt, coffee grounds, or other unwanted substance. Seal  the mixture in a bag or container. Put it in the trash. NOTE: This sheet is a summary. It may not cover all possible information. If you have questions about this medicine, talk to your doctor, pharmacist, or health care provider.  2024 Elsevier/Gold Standard (2021-02-21 00:00:00) Ubrogepant Tablets What is this medication? UBROGEPANT (ue BROE je pant) treats migraines. It works by blocking a substance in the body that causes migraines. It is not used to prevent migraines. This medicine may be used for other purposes; ask your health care provider or pharmacist if you have questions. COMMON BRAND NAME(S): Bernita Raisin What should I tell my care team before I take this medication? They need to know if you have any of these conditions: Kidney disease Liver disease An unusual or allergic reaction to ubrogepant, other medications, foods, dyes, or preservatives Pregnant or trying to get pregnant Breast-feeding How should I use this medication? Take this medication by mouth with a glass of water. Take it as directed on the prescription label. You can take it with or without food. If it upsets your stomach, take it with food. Keep taking it unless your care team tells you to stop. Talk to your care team about the use of this medication in children. Special care may be needed. Overdosage: If you think you have taken too much of this medicine contact a poison control center or emergency room at once. NOTE: This medicine is only for you. Do not share this medicine with others. What if I miss a dose? This does not apply. This medication is not for regular use. What may interact with this medication? Do not take this medication with any of the following: Adagrasib Ceritinib Certain antibiotics, such as chloramphenicol, clarithromycin, telithromycin Certain antivirals for HIV, such as atazanavir, cobicistat, darunavir, delavirdine, fosamprenavir, indinavir, ritonavir Certain medications for fungal  infections, such as itraconazole, ketoconazole, posaconazole, voriconazole Conivaptan Grapefruit Idelalisib Mifepristone Nefazodone Ribociclib This medication may also interact with the following: Carvedilol Certain medications for seizures, such as phenobarbital, phenytoin Ciprofloxacin Cyclosporine Eltrombopag Fluconazole Fluvoxamine Quinidine Rifampin St. John's wort Verapamil This list may not describe all possible interactions. Give your health care provider a list of all the medicines, herbs, non-prescription drugs, or dietary supplements you use. Also tell them if you smoke, drink alcohol, or use illegal drugs. Some items may interact with your medicine. What should I watch for while using this medication? Visit your care team for regular checks on your progress. Tell your care team if your symptoms do not start to get better or if they get worse. Your mouth may get dry. Chewing sugarless gum or sucking hard candy and drinking plenty of water may help. Contact your care team if the problem  does not go away or is severe. What side effects may I notice from receiving this medication? Side effects that you should report to your care team as soon as possible: Allergic reactions--skin rash, itching, hives, swelling of the face, lips, tongue, or throat Side effects that usually do not require medical attention (report to your care team if they continue or are bothersome): Drowsiness Dry mouth Fatigue Nausea This list may not describe all possible side effects. Call your doctor for medical advice about side effects. You may report side effects to FDA at 1-800-FDA-1088. Where should I keep my medication? Keep out of the reach of children and pets. Store between 15 and 30 degrees C (59 and 86 degrees F). Get rid of any unused medication after the expiration date. To get rid of medications that are no longer needed or have expired: Take the medication to a medication take-back  program. Check with your pharmacy or law enforcement to find a location. If you cannot return the medication, check the label or package insert to see if the medication should be thrown out in the garbage or flushed down the toilet. If you are not sure, ask your care team. If it is safe to put it in the trash, pour the medication out of the container. Mix the medication with cat litter, dirt, coffee grounds, or other unwanted substance. Seal the mixture in a bag or container. Put it in the trash. NOTE: This sheet is a summary. It may not cover all possible information. If you have questions about this medicine, talk to your doctor, pharmacist, or health care provider.  2024 Elsevier/Gold Standard (2021-02-23 00:00:00)

## 2022-09-27 ENCOUNTER — Encounter: Payer: Self-pay | Admitting: Neurology

## 2022-09-27 DIAGNOSIS — G43709 Chronic migraine without aura, not intractable, without status migrainosus: Secondary | ICD-10-CM | POA: Insufficient documentation

## 2022-09-27 DIAGNOSIS — G43711 Chronic migraine without aura, intractable, with status migrainosus: Secondary | ICD-10-CM | POA: Insufficient documentation

## 2022-09-27 MED ORDER — AJOVY 225 MG/1.5ML ~~LOC~~ SOAJ
225.0000 mg | SUBCUTANEOUS | 11 refills | Status: DC
Start: 2022-09-27 — End: 2023-01-29
  Filled 2022-09-27 – 2022-10-24 (×2): qty 1.5, 30d supply, fill #0
  Filled 2022-11-18: qty 1.5, 30d supply, fill #1

## 2022-09-28 ENCOUNTER — Other Ambulatory Visit (HOSPITAL_BASED_OUTPATIENT_CLINIC_OR_DEPARTMENT_OTHER): Payer: Self-pay

## 2022-09-30 ENCOUNTER — Other Ambulatory Visit (HOSPITAL_BASED_OUTPATIENT_CLINIC_OR_DEPARTMENT_OTHER): Payer: Self-pay

## 2022-10-01 ENCOUNTER — Other Ambulatory Visit: Payer: Self-pay

## 2022-10-01 ENCOUNTER — Other Ambulatory Visit (HOSPITAL_BASED_OUTPATIENT_CLINIC_OR_DEPARTMENT_OTHER): Payer: Self-pay

## 2022-10-14 ENCOUNTER — Other Ambulatory Visit (HOSPITAL_BASED_OUTPATIENT_CLINIC_OR_DEPARTMENT_OTHER): Payer: Self-pay

## 2022-10-15 ENCOUNTER — Other Ambulatory Visit (HOSPITAL_BASED_OUTPATIENT_CLINIC_OR_DEPARTMENT_OTHER): Payer: Self-pay

## 2022-10-24 ENCOUNTER — Other Ambulatory Visit (HOSPITAL_BASED_OUTPATIENT_CLINIC_OR_DEPARTMENT_OTHER): Payer: Self-pay

## 2022-11-05 ENCOUNTER — Other Ambulatory Visit: Payer: Self-pay | Admitting: Neurology

## 2022-11-05 ENCOUNTER — Other Ambulatory Visit (HOSPITAL_BASED_OUTPATIENT_CLINIC_OR_DEPARTMENT_OTHER): Payer: Self-pay

## 2022-11-06 ENCOUNTER — Encounter: Payer: Self-pay | Admitting: *Deleted

## 2022-11-15 ENCOUNTER — Other Ambulatory Visit (HOSPITAL_BASED_OUTPATIENT_CLINIC_OR_DEPARTMENT_OTHER): Payer: Self-pay

## 2022-11-18 ENCOUNTER — Other Ambulatory Visit (HOSPITAL_BASED_OUTPATIENT_CLINIC_OR_DEPARTMENT_OTHER): Payer: Self-pay

## 2022-11-18 ENCOUNTER — Other Ambulatory Visit (HOSPITAL_COMMUNITY): Payer: Self-pay

## 2022-12-23 ENCOUNTER — Other Ambulatory Visit (HOSPITAL_BASED_OUTPATIENT_CLINIC_OR_DEPARTMENT_OTHER): Payer: Self-pay

## 2022-12-25 ENCOUNTER — Other Ambulatory Visit (HOSPITAL_BASED_OUTPATIENT_CLINIC_OR_DEPARTMENT_OTHER): Payer: Self-pay

## 2023-01-29 ENCOUNTER — Telehealth: Payer: Self-pay | Admitting: Neurology

## 2023-01-29 ENCOUNTER — Other Ambulatory Visit (HOSPITAL_BASED_OUTPATIENT_CLINIC_OR_DEPARTMENT_OTHER): Payer: Self-pay

## 2023-01-29 ENCOUNTER — Telehealth: Payer: 59 | Admitting: Neurology

## 2023-01-29 DIAGNOSIS — G43711 Chronic migraine without aura, intractable, with status migrainosus: Secondary | ICD-10-CM

## 2023-01-29 DIAGNOSIS — G43001 Migraine without aura, not intractable, with status migrainosus: Secondary | ICD-10-CM | POA: Insufficient documentation

## 2023-01-29 MED ORDER — AJOVY 225 MG/1.5ML ~~LOC~~ SOAJ: 225.0000 mg | SUBCUTANEOUS | 11 refills | Status: DC

## 2023-01-29 MED ORDER — UBRELVY 100 MG PO TABS
100.0000 mg | ORAL_TABLET | ORAL | 11 refills | Status: DC | PRN
Start: 2023-01-29 — End: 2023-01-29
  Filled 2023-01-29: qty 16, 30d supply, fill #0

## 2023-01-29 MED ORDER — UBRELVY 100 MG PO TABS: 100.0000 mg | ORAL_TABLET | ORAL | 11 refills | Status: AC | PRN

## 2023-01-29 NOTE — Progress Notes (Addendum)
 [facility]    Provider:  Dr Lucia Gaskins Requesting Provider: Thana Ates, MD Primary Care Provider:  Thana Ates, MD  CC:  chronic migraines  Virtual Visit via Video Note  I connected with Lindsay Stanley on 01/29/23 at  9:30 AM EST by a video enabled telemedicine application and verified that I am speaking with the correct person using two identifiers.  Location: Patient: her place of work in her car Provider: provider's office   I discussed the limitations of evaluation and management by telemedicine and the availability of in person appointments. The patient expressed understanding and agreed to proceed.   Follow Up Instructions:    I discussed the assessment and treatment plan with the patient. The patient was provided an opportunity to ask questions and all were answered. The patient agreed with the plan and demonstrated an understanding of the instructions.   The patient was advised to call back or seek an in-person evaluation if the symptoms worsen or if the condition fails to improve as anticipated.  I provided 20 minutes of non-face-to-face time during this encounter.   Anson Fret, MD  01/29/2023: She is doing fantastic on Ajovy. At baseline prior to Ajovy she had  23 migraine days a month.  Now she only has 5 less severe migraine days a month and <8 total headache days a month. Will prescribe Ubrelvy acutely. Follow up once a year  Patient complains of symptoms per HPI as well as the following symptoms: none . Pertinent negatives and positives per HPI. All others negative  HPI:  Lindsay Stanley is a 53 y.o. female here as requested by Thana Ates, MD for chronic migraines. She gets her migraines 2-3 days before period as well as other times and maybe in the middle when tapering off. Worse with stress. And then randomly during the month. Getting stressed will give her a migraine. She can feel it coming and gradually gets worse. Sometimes will  wake in the middle of the night. Left side, pulsating/pounding, nausea and vomiting, moderate to severe can last 24 hours. 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 Severe, "last month was hell", Last month she ran out of medication and she was sick and missed work. Light sensitivity. Last month and for at least 4 months she had 23 migraine days a month moderate to severe and last a day. She takes goody powders, caffeine and dramamine and then sleep may help.  No aura. No med overuse. No other focal neurologic deficits, associated symptoms, inciting events or modifiable factors.  Tried > 3 months from a thorough review of records and patient reported history: topiramate(hair fall out) , gabapentin. Tegretol, amitriptyline, metoprolol, zomig, sumatriptan, zonisamide. She has hypotension. Aimovig contraindicated due to constipation. No aura. Nurtec was ineffective as preventative she had samples and also it is not approved as prevention for chronic migraines. Trying ubrelvy as acute.    Reviewed notes, labs and imaging from outside physicians, which showed:  02/29/2020: mri brain: IMPRESSION: 1.  Abnormal region of restricted diffusion with associated T2/FLAIR hyperintensity within the right frontal lobe compatible with known acute to subacute infarction.  Electronically Signed by: Marlowe Sax on 02/29/2020 10:27 AM Narrative  MRI HEAD WO IV CONTRAST  INDICATION:  stroke  COMPARISON: February 28, 2020 CT head  TECHNIQUE: Imaging of the brain was performed in 3 planes utilizing a combination of T1, FLAIR, and T2 weighting. Diffusion images were performed.  FINDINGS: Abnormal region of restricted diffusion  with associated T2/FLAIR hyperintensity within the right frontal lobe compatible with known acute to subacute infarction. No mass or mass effect. No abnormal extra-axial fluid collection.  No hydrocephalus. Abnormal flow void within the right internal carotid artery  secondary to occlusion an severe stenosis is better evaluated on the CTA from one day prior.  The included paranasal sinuses are predominantly clear. The mastoid air cells are clear. Normal marrow signal pattern. Procedure Note  Marlowe Sax, MD - 02/29/2020 Formatting of this note might be different from the original. MRI HEAD WO IV CONTRAST  INDICATION:  stroke  COMPARISON: February 28, 2020 CT head  TECHNIQUE: Imaging of the brain was performed in 3 planes utilizing a combination of T1, FLAIR, and T2 weighting. Diffusion images were performed.  FINDINGS: Abnormal region of restricted diffusion with associated T2/FLAIR hyperintensity within the right frontal lobe compatible with known acute to subacute infarction. No mass or mass effect. No abnormal extra-axial fluid collection.  No hydrocephalus. Abnormal flow void within the right internal carotid artery secondary to occlusion an severe stenosis is better evaluated on the CTA from one day prior.  The included paranasal sinuses are predominantly clear. The mastoid air cells are clear. Normal marrow signal pattern.   IMPRESSION: 1.  Abnormal region of restricted diffusion with associated T2/FLAIR hyperintensity within the right frontal lobe compatible with known acute to subacute infarction.  Carotid US 02-29-2020: IMPRESSION: 1.  No hemodynamically significant stenosis on either side.   2.  Both vertebral arteries are patent with antegrade flow.  Degree of stenosis is determined using NASCET measurement technique: Severe:  70-90% Moderate:  50-69% Mild:  Less than 50%  Electronically Signed by: Marlise Eves on 03/01/2020 7:56 AM Narrative  INDICATION:  CVA COMPARISON: None. TECHNIQUE: Grayscale and color-flow Doppler imaging of the carotid vessels was obtained with spectral analysis. Reported velocities are  PSV/EDV in cm/sec. LIMITING FACTORS (Tortuosity, Short Neck, Patient Motion):  None.  FINDINGS:  RIGHT  CAROTID SYSTEM: Right common carotid:  91/16 Right internal carotid:  139/33 Right ICA/CCA ratio:  1.52 Right external carotid:  Patent with anterograde flow. Normal spectral waveforms.  LEFT CAROTID SYSTEM: Left common carotid:  137/34 Left internal carotid:  190/104 Left ICA/CCA ratio:  1.38 Left external carotid:  Patent with anterograde flow. Normal spectral waveforms.  VERTEBRAL ARTERIES: Right vertebral:  Anterograde with normal spectral waveform.   Left vertebral:  Anterograde with normal spectral waveform.    ADDITIONAL/INCIDENTAL FINDINGS:   Bilateral carotid bifurcation plaque is present.  Sodium -- -- -- -- 139 Load older lab results  Potassium -- 4.6 -- 4.3 4.0 Load older lab results  Chloride -- -- -- -- 102 Load older lab results  Carbon Dioxide (CO2) -- -- -- -- -- Load older lab results  Anion Gap -- -- -- -- -- Load older lab results  Glucose -- 102 High     -- 92 106 High    Load older lab results  BUN -- 14 -- 17 22 Load older lab results  Creatinine -- 0.80 -- 0.68 0.76 Load older lab results  Calcium -- -- -- -- -- Load older lab results  Alkaline Phosphatase 70 -- -- -- 58 Load older lab results  Total Bilirubin 0.2 -- -- -- 0.2 Load older lab results  Total Protein 6.7 -- -- 6.3 6.9 Load older lab results  Albumin, Serum 4.4 -- -- -- 4.3 Load older lab results  Globulin -- -- -- -- -- Load older lab results  Albumin/Globulin  Ratio -- -- -- -- 1.7 Load older lab results  BUN/Creatinine Ratio -- -- -- -- 29 High    Load older lab results  ALT (SGPT) 18 -- -- -- 26 Load older lab results  AST 16 -- -- 21 21 Load older lab results  GFR-African American -- -- -- -- -- Load older lab results  GFR Non-African American -- -- -- -- -- Load older lab results  Creatinine, Serum -- -- -- -- -- Load older lab results  eGFR If NonAfrican American -- -- -- -- 93 Load older lab results  eGFR If African American -- -- -- -- 107 Load older lab results  CO2 -- 27 --  25 25 Load older lab results  CALCIUM -- -- -- -- 9.4 Load older lab results  Globulin, Total -- -- -- -- 2.6 Load older lab results  Bilirubin, Direct <0.10 -- -- -- --   Lipase -- -- -- -- -- Load older lab results  Amylase -- -- -- -- -- Load older lab results  Na -- 139 -- 136 --   Cl -- 103 -- 100 --   AGAP -- 9 -- 11 --   Ca -- 9.3 -- 9.1 --   ALK PHOS -- -- -- 58 --   T Bili -- -- -- 0.15 --   Alb -- -- -- 4.5 --   GLOBULIN -- -- -- 1.8 --   ALBUMIN/GLOBULIN RATIO -- -- -- 2.5 --   BUN/CREAT RATIO -- 17.5 -- 25.0 --   ALT -- -- -- 26 --   GFR AFRICAN AMERICAN -- 101 -- 120 --   GFR Non African American -- 87 -- 104 --   Mg -- -- 2.1      02/29/2020: hgba1c 5.0  Review of Systems: Patient complains of symptoms per HPI as well as the following symptoms headaches. Pertinent negatives and positives per HPI. All others negative.   Social History   Socioeconomic History   Marital status: Single    Spouse name: Not on file   Number of children: Not on file   Years of education: Not on file   Highest education level: Not on file  Occupational History   Not on file  Tobacco Use   Smoking status: Never   Smokeless tobacco: Never  Substance and Sexual Activity   Alcohol use: Yes    Comment: occ   Drug use: No   Sexual activity: Not on file  Other Topics Concern   Not on file  Social History Narrative   Not on file   Social Drivers of Health   Financial Resource Strain: Low Risk  (02/29/2020)   Received from [facility], [facility]   Overall Financial Resource Strain (CARDIA)    Difficulty of Paying Living Expenses: Not hard at all  Food Insecurity: No Food Insecurity (02/28/2020)   Received from [facility], [facility]   Hunger Vital Sign    Worried About Running Out of Food in the Last Year: Never true    Ran Out of Food in the Last Year: Never true  Transportation Needs: Not on file  Physical Activity: Not on file  Stress: No Stress Concern Present  (02/29/2020)   Received from [facility], [facility]   [employer] [facility] Occupational [facility] - Occupational Stress Questionnaire    Feeling of Stress : Not at all  Social Connections: Unknown (05/25/2021)   [facility], [facility]   Social Network  Social Network: Not on file  Intimate Partner Violence: Not At Risk (12/09/2022)   Received from [facility]   HITS    Over the last 12 months how often did your partner physically hurt you?: Never    Over the last 12 months how often did your partner insult you or talk down to you?: Never    Over the last 12 months how often did your partner threaten you with physical harm?: Never    Over the last 12 months how often did your partner scream or curse at you?: Never    Family History  Problem Relation Age of Onset   Migraines Neg Hx     Past Medical History:  Diagnosis Date   Migraines     Patient Active Problem List   Diagnosis Date Noted   Migraine without aura and with status migrainosus, not intractable 01/29/2023   Chronic migraine without aura without status migrainosus, not intractable 09/27/2022   Chronic migraine without aura, with intractable migraine, so stated, with status migrainosus 09/27/2022    Past Surgical History:  Procedure Laterality Date   TUBAL LIGATION      Current Outpatient Medications  Medication Sig Dispense Refill   amitriptyline (ELAVIL) 50 MG tablet Take 1 tablet (50 mg total) by mouth at bedtime. 30 tablet 3   amitriptyline (ELAVIL) 50 MG tablet Take 1 tablet (50 mg total) by mouth at bedtime. 30 tablet 3   Clobetasol Propionate 0.05 % shampoo Apply to scalp every 2-3 days 118 mL 1   Fremanezumab-vfrm (AJOVY) 225 MG/1.5ML SOAJ Inject 225 mg into the skin every 30 (thirty) days.     Fremanezumab-vfrm (AJOVY) 225 MG/1.5ML SOAJ Inject 225 mg into the skin every 30 (thirty) days 1.5 mL 11   ibuprofen (ADVIL,MOTRIN) 800 MG tablet Take 1 tablet (800 mg total) by mouth 3  (three) times daily. 21 tablet 0   rosuvastatin (CRESTOR) 5 MG tablet Take 1 tablet (5 mg total) by mouth daily. 90 tablet 2   Ubrogepant (UBRELVY) 100 MG TABS Take 1 tablet (100 mg total) by mouth every 2 (two) hours as needed. Maximum 200mg  a day. Please use copay card : BIN [id number] PCN CNRX GRP [id number] ID [id number] EXP 01/14/2024 16 tablet 11   No current facility-administered medications for this visit.    Allergies as of 01/29/2023 - Review Complete 09/24/2022  Allergen Reaction Noted   Topiramate  09/24/2022    Vitals: There were no vitals taken for this visit. Last Weight:  Wt Readings from Last 1 Encounters:  09/24/22 232 lb (105.2 kg)   Last Height:   Ht Readings from Last 1 Encounters:  09/24/22 5\' 8"  (1.727 m)    Physical exam: Exam: Gen: NAD, conversant      CV: No palpitations or chest pain or SOB. VS: Breathing at a normal rate. Weight appears within normal limits. Not febrile. Eyes: Conjunctivae clear without exudates or hemorrhage  Neuro: Detailed Neurologic Exam  Speech:    Speech is normal; fluent and spontaneous with normal comprehension.  Cognition:    The patient is oriented to person, place, and time;     recent and remote memory intact;     language fluent;     normal attention, concentration, fund of knowledge Cranial Nerves:    The pupils are equal, round, and reactive to light. Visual fields are full Extraocular movements are intact.  The face is symmetric with normal sensation. The palate elevates in the midline. Hearing intact. Voice is  normal. Shoulder shrug is normal. The tongue has normal motion without fasciculations.   Coordination: normal  Gait:    No abnormalities noted or reported  Motor Observation:   no involuntary movements noted. Tone:    Appears normal  Posture:    Posture is normal. normal erect    Strength:    Strength is anti-gravity and symmetric in the upper and lower limbs.      Sensation: intact to LT, no  reports of numbness or tingling or paresthesias        Assessment/Plan:  patient with chronic migraines and h/o stroke  She is doing fantastic on Ajovy. At baseline prior to Ajovy she had  23 migraine days a month.  Now she only has 5 less severe migraine days a month and <8 total headache days a month. Will prescribe Ubrelvy acutely. Follow up once a year. She has tried and failed zomig and sumatriptan.  - continue Ajovy for prevention - If had a carotid dissection could still use triptans but recommend against it for precaution.Start Bernita Raisin. Failed sumatriptan and zolmitriptan int he past. - Prescribed Bernita Raisin today, signed her up for the copay card and placed it int he prescroption per her approval/consent: copay card : BIN [id number] PCN CNRX GRP [id number] ID [id number] EXP 01/14/2024 - emailed my team to get her a f/u appointment in early January 2026  'Other new meds include Vyepti (infusion every 3 months) Always Botox which is also great Aimovig also but that one has had some side effects we would have to discuss  Tried > 3 months from a thorough review of records and patient reported history: topiramate(hair fall out) , gabapentin. Tegretol, amitriptyline, metoprolol, zomig, sumatriptan, zonisamide. She has hypotension. Aimovig contraindicated due to constipation. No aura. Nurtec was ineffective as preventative she had samples and also it is not approved as prevention for chronic migraines. Trying ubrelvy as acute.   Meds ordered this encounter  Medications   Ubrogepant (UBRELVY) 100 MG TABS    Sig: Take 1 tablet (100 mg total) by mouth every 2 (two) hours as needed. Maximum 200mg  a day. Please use copay card : BIN [id number] PCN CNRX GRP [id number] ID [id number] EXP 01/14/2024    Dispense:  16 tablet    Refill:  11    Please use copay card : BIN [id number] PCN CNRX GRP [id number] ID [id number] EXP 01/14/2024   To prevent or relieve headaches, try the following: Cool Compress. Lie down  and place a cool compress on your head.  Avoid headache triggers. If certain foods or odors seem to have triggered your migraines in the past, avoid them. A headache diary might help you identify triggers.  Include physical activity in your daily routine. Try a daily walk or other moderate aerobic exercise.  Manage stress. Find healthy ways to cope with the stressors, such as delegating tasks on your to-do list.  Practice relaxation techniques. Try deep breathing, yoga, massage and visualization.  Eat regularly. Eating regularly scheduled meals and maintaining a healthy diet might help prevent headaches. Also, drink plenty of fluids.  Follow a regular sleep schedule. Sleep deprivation might contribute to headaches Consider biofeedback. With this mind-body technique, you learn to control certain bodily functions -- such as muscle tension, heart rate and blood pressure -- to prevent headaches or reduce headache pain.    Proceed to emergency room if you experience new or worsening symptoms or symptoms do not resolve, if you have new neurologic symptoms or if headache is  severe, or for any concerning symptom.   Cc: Thana Ates, MD,  Thana Ates, MD  Naomie Dean, MD  [facility] [address] [place], [place] [address]  Phone [phone] Fax [phone]

## 2023-01-29 NOTE — Telephone Encounter (Signed)
 Pt states refills of Fremanezumab -vfrm (AJOVY ) 225 MG/1.5ML SOAJ and Ubrogepant  (UBRELVY ) 100 MG TABS where sent to the wrong pharmacy. Requesting they be sent to [facility]

## 2023-01-29 NOTE — Telephone Encounter (Signed)
 Resent to [employer].  (Ajovy  and Ubrelvy ).

## 2023-01-29 NOTE — Telephone Encounter (Signed)
 Please call and make patient a 1 year follow(early January would be good) up with an NP prefer megan can be video thank you

## 2023-01-29 NOTE — Telephone Encounter (Signed)
 Pt scheduled for VV with Megan for 01/27/24 at 3:00pm

## 2023-02-03 ENCOUNTER — Telehealth: Payer: Self-pay

## 2023-02-03 ENCOUNTER — Other Ambulatory Visit (HOSPITAL_COMMUNITY): Payer: Self-pay

## 2023-02-03 NOTE — Telephone Encounter (Signed)
Pharmacy Patient Advocate Encounter   Received notification from Physician's Office that prior authorization for Ubrelvy 100MG  tablets is required/requested.   Insurance verification completed.   The patient is insured through [facility] .   Per test claim: PA required; PA submitted to above mentioned insurance via CoverMyMeds Key/confirmation #/EOC [facility] Status is pending

## 2023-02-03 NOTE — Telephone Encounter (Signed)
PA request has been Submitted. New Encounter created for follow up. For additional info see Pharmacy Prior Auth telephone encounter from 02/03/2023.

## 2023-02-03 NOTE — Telephone Encounter (Signed)
Pt is being told that a PA is needed for  Ubrogepant (UBRELVY) 100 MG TABS

## 2023-02-05 ENCOUNTER — Other Ambulatory Visit (HOSPITAL_COMMUNITY): Payer: Self-pay

## 2023-02-05 ENCOUNTER — Telehealth: Payer: Self-pay

## 2023-02-05 NOTE — Telephone Encounter (Signed)
Pharmacy Patient Advocate Encounter   Received notification from CoverMyMeds that prior authorization for AJOVY (fremanezumab-vfrm) injection 225MG /1.5ML auto-injectors is required/requested.   Insurance verification completed.   The patient is insured through [facility] .   Per test claim: PA required; PA submitted to above mentioned insurance via CoverMyMeds Key/confirmation #/EOC [employer] Status is pending

## 2023-02-10 ENCOUNTER — Other Ambulatory Visit (HOSPITAL_COMMUNITY): Payer: Self-pay

## 2023-02-10 NOTE — Telephone Encounter (Signed)
Pharmacy Patient Advocate Encounter  Received notification from [facility] that Prior Authorization for Ubrelvy 100MG  tablets has been APPROVED from 02/05/2023 to 02/05/2024. Unable to obtain price due to refill too soon rejection, last fill date 02/08/2023 next available fill date2/09/2023   PA #/Case ID/Reference #: PA Case ID #: [id number]

## 2023-02-10 NOTE — Telephone Encounter (Signed)
I notified the patient .

## 2023-02-27 NOTE — Telephone Encounter (Signed)
I called [facility].  Pt had copay card.  Did fill in January.  Has not filled since.  It is every 30 days.

## 2023-03-04 NOTE — Telephone Encounter (Signed)
 Pharmacy Patient Advocate Encounter  Received notification from [facility] that Prior Authorization for AJOVY (fremanezumab-vfrm) injection 225MG /1.5ML auto-injectors has been DENIED.  Full denial letter will be uploaded to the media tab. See denial reason below.   PA #/Case ID/Reference #: PA Case ID #: [id number]

## 2023-03-05 NOTE — Telephone Encounter (Signed)
 Ajovy denied, not on her plan, nurtec is option.  Would that work for pt?

## 2023-03-06 NOTE — Telephone Encounter (Signed)
 So they do not cover Ajovy at all? I gave her nurtec samples in the past were not effective, also she has chronic migraines so Nurtec is not approved for chronic migraines. Does her insurance cover emgality?   Maxine Glenn would you know if her insurance covers any of the cgrp injectables?  Sandy, let patient know we can provide her 4 ajovy samples until we figure this out, she may have to call her insurance and see which injectables they cover. Please sign out and tell her she can pick up the samples  In the meantime, how do we find out if her insurance covers ajovy or the injectables? I can ddend my note about th nurtec and if so can we write an appeal?

## 2023-03-13 ENCOUNTER — Other Ambulatory Visit (HOSPITAL_COMMUNITY): Payer: Self-pay

## 2023-03-13 ENCOUNTER — Telehealth: Payer: Self-pay

## 2023-03-13 DIAGNOSIS — G43709 Chronic migraine without aura, not intractable, without status migrainosus: Secondary | ICD-10-CM

## 2023-03-13 NOTE — Telephone Encounter (Signed)
 Pharmacy Patient Advocate Encounter   Received notification from Pt Calls Messages that prior authorization resubmission for AJOVY (fremanezumab-vfrm) injection 225MG /1.5ML auto-injectors is required/requested.   Insurance verification completed.   The patient is insured through [facility] .   Per test claim: PA required; PA submitted to above mentioned insurance via CoverMyMeds Key/confirmation #/EOC [facility] Status is pending

## 2023-03-13 NOTE — Telephone Encounter (Signed)
 Noted thanks

## 2023-03-13 NOTE — Telephone Encounter (Signed)
 PA request has been  Resubmitted . New Encounter created for follow up. For additional info see Pharmacy Prior Auth telephone encounter from 03-13-2023.

## 2023-03-13 NOTE — Telephone Encounter (Signed)
 Please call and see if she is getitng the Ajovy. She picked it up in January but I don;t know the status in February. Let her know that Ajovy was denied until she tried nurtec but I suspect she is either better or she is getting the Ajovy since we have not heard from her thanks

## 2023-03-13 NOTE — Telephone Encounter (Signed)
 I called the pt and LVM (ok per DPR) asking for call back to discuss her Ajovy.

## 2023-03-13 NOTE — Telephone Encounter (Signed)
 The last pharmacy fill states 01/17/23. PA team, are we able to check and see which CGRP injectable insurance prefers? We have prescribed it for chronic migraines but the insurance is kicking back and suggesting Nurtec which is only approved for episodic migraine prevention.

## 2023-03-14 ENCOUNTER — Other Ambulatory Visit (HOSPITAL_COMMUNITY): Payer: Self-pay

## 2023-03-14 NOTE — Telephone Encounter (Signed)
 Pharmacy Patient Advocate Encounter  Received notification from [facility] that Prior Authorization for AJOVY (fremanezumab-vfrm) injection 225MG /1.5ML auto-injectors  has been DENIED.  Full denial letter will be uploaded to the media tab. See denial reason below.   PA #/Case ID/Reference #: [id number]

## 2023-03-17 NOTE — Telephone Encounter (Addendum)
 Surveyor, mining.  She has been on ajovy, no need for loading dose.

## 2023-03-17 NOTE — Addendum Note (Signed)
 Addended by: Guy Begin on: 03/17/2023 05:02 PM   Modules accepted: Orders

## 2023-03-18 ENCOUNTER — Telehealth: Payer: Self-pay

## 2023-03-18 ENCOUNTER — Other Ambulatory Visit (HOSPITAL_COMMUNITY): Payer: Self-pay

## 2023-03-18 MED ORDER — EMGALITY 120 MG/ML ~~LOC~~ SOAJ: 120.0000 mg | SUBCUTANEOUS | 11 refills | Status: AC

## 2023-03-18 NOTE — Telephone Encounter (Signed)
*  [facility]  Pharmacy Patient Advocate Encounter   Received notification from Pt Calls Messages that prior authorization for Emgality 120MG /ML auto-injectors (migraine)  is required/requested.   Insurance verification completed.   The patient is insured through [facility] .   Per test claim: PA required; PA submitted to above mentioned insurance via CoverMyMeds Key/confirmation #/EOC [id number] Status is pending

## 2023-03-18 NOTE — Telephone Encounter (Signed)
 Pharmacy Patient Advocate Encounter  Received notification from [facility] that Prior Authorization for Emgality 120MG /ML auto-injectors (migraine)  has been APPROVED from 03/18/2023 to 03/17/2024. Ran test claim, Copay is $35.00. This test claim was processed through [facility]- copay amounts may vary at other pharmacies due to pharmacy/plan contracts, or as the patient moves through the different stages of their insurance plan.

## 2023-03-18 NOTE — Addendum Note (Signed)
 Addended by: Naomie Dean B on: 03/18/2023 08:38 AM   Modules accepted: Orders

## 2023-03-18 NOTE — Telephone Encounter (Signed)
 Order signed pleas einform patient to sign up for the copay card and present to pharmacy will help if copay is high thanks

## 2023-03-18 NOTE — Telephone Encounter (Signed)
 PA request has been Submitted. New Encounter has been or will be created for follow up. For additional info see Pharmacy Prior Auth telephone encounter from 03/04.

## 2023-03-18 NOTE — Telephone Encounter (Signed)
 I called the pt's pharmacy and advised them of [employer] approval. They will get it ready for the patient. I called the pt and let her know as well. She asked about the savings card and I answered her question. She was appreciative.

## 2023-03-18 NOTE — Telephone Encounter (Signed)
 Spoke to pt.  I relayed that insurance denied ajovy.  Emgality is preferred.  Dr. Lucia Gaskins aware and changed to emgality.  She has been taking ajovy so will transition to taking Emgality (no loading dose needed).  Due next 03/24/2023.  Will need PA, will send to PA team to address.  Pt verbalized understanding.  Sent her emgality info and told to sign up for savings card.

## 2024-01-27 ENCOUNTER — Telehealth: Payer: 59 | Admitting: Adult Health
